# Patient Record
Sex: Female | Born: 1965 | Race: White | Hispanic: No | Marital: Married | Smoking: Never smoker
Health system: Southern US, Community
[De-identification: ages and names within clinical notes are randomized; demographics above are authoritative.]

## PROBLEM LIST (undated history)

## (undated) HISTORY — PX: BREAST EXCISIONAL BIOPSY: SUR124

---

## 1999-09-30 HISTORY — PX: BREAST EXCISIONAL BIOPSY: SUR124

## 2013-12-25 ENCOUNTER — Encounter (HOSPITAL_COMMUNITY): Payer: Self-pay | Admitting: Emergency Medicine

## 2013-12-25 ENCOUNTER — Emergency Department (HOSPITAL_COMMUNITY)
Admission: EM | Admit: 2013-12-25 | Discharge: 2013-12-25 | Disposition: A | Discharge status: Home / Self Care | Payer: Self-pay | Attending: Emergency Medicine | Admitting: Emergency Medicine

## 2013-12-25 ENCOUNTER — Emergency Department (HOSPITAL_COMMUNITY): Payer: Self-pay

## 2013-12-25 DIAGNOSIS — J45901 Unspecified asthma with (acute) exacerbation: Secondary | ICD-10-CM | POA: Insufficient documentation

## 2013-12-25 DIAGNOSIS — Z79899 Other long term (current) drug therapy: Secondary | ICD-10-CM | POA: Insufficient documentation

## 2013-12-25 DIAGNOSIS — J45909 Unspecified asthma, uncomplicated: Secondary | ICD-10-CM

## 2013-12-25 DIAGNOSIS — IMO0002 Reserved for concepts with insufficient information to code with codable children: Secondary | ICD-10-CM | POA: Insufficient documentation

## 2013-12-25 LAB — BASIC METABOLIC PANEL
BUN: 16 mg/dL (ref 6–23)
CO2: 23 mEq/L (ref 19–32)
Calcium: 9.6 mg/dL (ref 8.4–10.5)
Chloride: 101 mEq/L (ref 96–112)
Creatinine, Ser: 0.67 mg/dL (ref 0.50–1.10)
GFR calc Af Amer: >90 mL/min (ref >90)
GFR calc non Af Amer: >90 mL/min (ref >90)
Glucose, Bld: 165 mg/dL — ABNORMAL HIGH (ref 70–99)
Potassium: 3.2 mEq/L — ABNORMAL LOW (ref 3.7–5.3)
Sodium: 138 mEq/L (ref 137–147)

## 2013-12-25 LAB — CBC
HCT: 41.4 % (ref 36.0–46.0)
Hemoglobin: 13.8 g/dL (ref 12.0–15.0)
MCH: 31.6 pg (ref 26.0–34.0)
MCHC: 33.3 g/dL (ref 30.0–36.0)
MCV: 94.7 fL (ref 78.0–100.0)
PLATELETS: 224 10*3/uL (ref 150–400)
RBC: 4.37 MIL/uL (ref 3.87–5.11)
RDW: 13.2 % (ref 11.5–15.5)
WBC: 10.9 10*3/uL — AB (ref 4.0–10.5)

## 2013-12-25 MED ORDER — ALBUTEROL SULFATE HFA 108 (90 BASE) MCG/ACT IN AERS
2.0000 | INHALATION_SPRAY | RESPIRATORY_TRACT | Status: DC | PRN
Start: 2013-12-25 — End: 2019-08-15

## 2013-12-25 MED ORDER — PREDNISONE 50 MG PO TABS
60.0000 mg | ORAL_TABLET | Freq: Once | ORAL | Status: AC
  Administered 2013-12-25: 60 mg via ORAL
  Filled 2013-12-25 (×2): qty 1

## 2013-12-25 MED ORDER — OMEPRAZOLE 20 MG PO CPDR: 20.0000 mg | DELAYED_RELEASE_CAPSULE | Freq: Every day | ORAL | Status: DC

## 2013-12-25 MED ORDER — PREDNISONE 10 MG PO TABS: 60.0000 mg | ORAL_TABLET | Freq: Every day | ORAL | Status: DC

## 2013-12-25 NOTE — ED Provider Notes (Signed)
CSN: [id number]     Arrival date & time 12/25/13  0422 History   First MD Initiated Contact with Patient 12/25/13 0430     Chief Complaint  Patient presents with  . Asthma      HPI Patient reports a history of adult-onset asthma.  She started using inhalers while she was in college intermittently.  She states over the past several months she's had intermittent issues with shortness of breath.  She recently finished a course of antibiotics for suspected pneumonia by her primary care physician.  Her primary care physician she reports did do a chest x-ray by never heard the results of the chest x-ray as to whether or not she had a true infiltrate.  She has not seen a pulmonologist.  She's been using Qvar at home with intermittent relief.  She states occasionally she'll get a tightness sensation in her anterior throat with some shortness of breath.  She was in her normal state of health over the past several days and awoke early this morning with severe shortness of breath that required her to call EMS. It reported to me by the nursing  staff that when EMS showed up the patient did not have any significant wheezing on exam but seemed to respond well to albuterol.  On arrival emergency department she is without any wheezing and states that she feels significantly better.  She has no chest discomfort or pain at this time.  She denies a history of orthopnea or lower extremity edema.  History of DVT or pulmonary embolism.   History reviewed. No pertinent past medical history. History reviewed. No pertinent past surgical history. No family history on file. History  Substance Use Topics  . Smoking status: Not on file  . Smokeless tobacco: Not on file  . Alcohol Use: Not on file   OB History   Grav Para Term Preterm Abortions TAB SAB Ect Mult Living                 Review of Systems  All other systems reviewed and are negative.      Allergies  Review of patient's allergies indicates no known  allergies.  Home Medications   Current Outpatient Rx  Name  Route  Sig  Dispense  Refill  . beclomethasone (QVAR) 40 MCG/ACT inhaler   Inhalation   Inhale 2 puffs into the lungs 2 (two) times daily.         Marland Kitchen albuterol (PROVENTIL HFA;VENTOLIN HFA) 108 (90 BASE) MCG/ACT inhaler   Inhalation   Inhale 2 puffs into the lungs every 4 (four) hours as needed for wheezing or shortness of breath.   1 Inhaler   0   . predniSONE (DELTASONE) 10 MG tablet   Oral   Take 6 tablets (60 mg total) by mouth daily.   24 tablet   0    BP 141/79  Pulse 94  Temp(Src) 98.1 F (36.7 C) (Oral)  Resp 18  Ht 5\' 8"  (1.727 m)  Wt 209 lb (94.802 kg)  BMI 31.79 kg/m2  SpO2 97% Physical Exam  Nursing note and vitals reviewed. Constitutional: She is oriented to person, place, and time. She appears well-developed and well-nourished. No distress.  HENT:  Head: Normocephalic and atraumatic.  Eyes: EOM are normal.  Neck: Normal range of motion.  Cardiovascular: Normal rate, regular rhythm and normal heart sounds.   Pulmonary/Chest: Effort normal and breath sounds normal. No accessory muscle usage. Not tachypneic. No respiratory distress.  Abdominal: Soft. She  exhibits no distension. There is no tenderness.  Musculoskeletal: Normal range of motion.  Neurological: She is alert and oriented to person, place, and time.  Skin: Skin is warm and dry.  Psychiatric: She has a normal mood and affect. Judgment normal.    ED Course  Procedures (including critical care time) Labs Review Labs Reviewed  CBC - Abnormal; Notable for the following:    WBC 10.9 (*)    All other components within normal limits  BASIC METABOLIC PANEL - Abnormal; Notable for the following:    Potassium 3.2 (*)    Glucose, Bld 165 (*)    All other components within normal limits   Imaging Review Dg Chest 2 View  12/25/2013   CLINICAL DATA:  Asthma, difficulty breathing.  EXAM: CHEST  2 VIEW  COMPARISON:  DG CHEST 2V dated  10/31/2013  FINDINGS: Cardiomediastinal silhouette is unremarkable. The lungs are clear without pleural effusions or focal consolidations. Mildly increased lung volumes with trace perihilar peribronchial cuffing. Trachea projects midline and there is no pneumothorax. Soft tissue planes and included osseous structures are non-suspicious.  IMPRESSION: Increased lung volumes with trace perihilar peribronchial cuffing may reflect reactive airway disease without focal consolidation.   Electronically Signed   By: Awilda Metroourtnay  Bloomer   On: 12/25/2013 05:30  I personally reviewed the imaging tests through PACS system I reviewed available ER/hospitalization records through the EMR    EKG Interpretation None      MDM   Final diagnoses:  Reactive airway disease    Patient continues to feel better this time.  Respiratory rate is 16.  No hypoxia.  Heart rate improved.  Chest x-ray without infiltrate.  Laboratory studies are without significant abnormality.  No anemia noted.  I think the patient will benefit from follow up with a pulmonologist.  There may be a component of her symptoms that are more bronchospastic in nature.  Her symptoms seem to be worse at night and therefore I will try the patient on Prilosec as she could be getting some silent gastroesophageal reflux disease leading to bronchospasm.  Patient will likely benefit from official pulmonary function tests.  There also could be some component of anxiety or panic attacks as she had a rather rapid response in her symptoms today.    Lyanne CoKevin M Emit Kuenzel, MD 12/25/13 [phone]

## 2013-12-25 NOTE — Discharge Instructions (Signed)
Bronchospasm, Adult A bronchospasm is a spasm or tightening of the airways going into the lungs. During a bronchospasm breathing becomes more difficult because the airways get smaller. When this happens there can be coughing, a whistling sound when breathing (wheezing), and difficulty breathing. Bronchospasm is often associated with asthma, but not all patients who experience a bronchospasm have asthma. CAUSES  A bronchospasm is caused by inflammation or irritation of the airways. The inflammation or irritation may be triggered by:   Allergies (such as to animals, pollen, food, or mold). Allergens that cause bronchospasm may cause wheezing immediately after exposure or many hours later.   Infection. Viral infections are believed to be the most common cause of bronchospasm.   Exercise.   Irritants (such as pollution, cigarette smoke, strong odors, aerosol sprays, and paint fumes).   Weather changes. Winds increase molds and pollens in the air. Rain refreshes the air by washing irritants out. Cold air may cause inflammation.   Stress and emotional upset.  SIGNS AND SYMPTOMS   Wheezing.   Excessive nighttime coughing.   Frequent or severe coughing with a simple cold.   Chest tightness.   Shortness of breath.  DIAGNOSIS  Bronchospasm is usually diagnosed through a history and physical exam. Tests, such as chest X-rays, are sometimes done to look for other conditions. TREATMENT   Inhaled medicines can be given to open up your airways and help you breathe. The medicines can be given using either an inhaler or a nebulizer machine.  Corticosteroid medicines may be given for severe bronchospasm, usually when it is associated with asthma. HOME CARE INSTRUCTIONS   Always have a plan prepared for seeking medical care. Know when to call your health care provider and local emergency services (911 in the [place] Know where you can access local emergency care.  Only take medicines as  directed by your health care provider.  If you were prescribed an inhaler or nebulizer machine, ask your health care provider to explain how to use it correctly. Always use a spacer with your inhaler if you were given one.  It is necessary to remain calm during an attack. Try to relax and breathe more slowly.  Control your home environment in the following ways:   Change your heating and air conditioning filter at least once a month.   Limit your use of fireplaces and wood stoves.  Do not smoke and do not allow smoking in your home.   Avoid exposure to perfumes and fragrances.   Get rid of pests (such as roaches and mice) and their droppings.   Throw away plants if you see mold on them.   Keep your house clean and dust free.   Replace carpet with wood, tile, or vinyl flooring. Carpet can trap dander and dust.   Use allergy-proof pillows, mattress covers, and box spring covers.   Wash bed sheets and blankets every week in hot water and dry them in a dryer.   Use blankets that are made of polyester or cotton.   Wash hands frequently. SEEK MEDICAL CARE IF:   You have muscle aches.   You have chest pain.   The sputum changes from clear or white to yellow, green, gray, or bloody.   The sputum you cough up gets thicker.   There are problems that may be related to the medicine you are given, such as a rash, itching, swelling, or trouble breathing.  SEEK IMMEDIATE MEDICAL CARE IF:   You have worsening wheezing and coughing   even after taking your prescribed medicines.   You have increased difficulty breathing.   You develop severe chest pain. MAKE SURE YOU:   Understand these instructions.  Will watch your condition.  Will get help right away if you are not doing well or get worse. Document Released: 09/18/2003 Document Revised: 05/18/2013 Document Reviewed: 03/07/2013 Exit[employer] Patient Information 2014 [employer].  

## 2013-12-25 NOTE — ED Notes (Signed)
Ongoing difficulty breathing since December. States she gets short of breath but her oxygen sats are "OK" and "they can't figure out whats wrong with me". Tonight woke with difficulty breathing again and called 911

## 2015-07-26 ENCOUNTER — Ambulatory Visit (INDEPENDENT_AMBULATORY_CARE_PROVIDER_SITE_OTHER): Payer: Self-pay | Admitting: Orthopedic Surgery

## 2015-07-26 ENCOUNTER — Ambulatory Visit (INDEPENDENT_AMBULATORY_CARE_PROVIDER_SITE_OTHER): Payer: Self-pay

## 2015-07-26 VITALS — BP 131/80 | Ht 68.0 in | Wt 206.0 lb

## 2015-07-26 DIAGNOSIS — M25522 Pain in left elbow: Secondary | ICD-10-CM

## 2015-07-26 DIAGNOSIS — M7712 Lateral epicondylitis, left elbow: Secondary | ICD-10-CM

## 2015-07-26 NOTE — Progress Notes (Signed)
Patient ID: Heidi ChaLisa M Santiago, female   DOB: 1966-01-06, 49 y.o.   MRN: [id number]  Chief Complaint  Patient presents with  . Elbow Pain    left elbow pain, unknown injury    HPI Heidi Santiago is a 49 y.o. female.  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 She complains of a constant 4 out of 10 lateral elbow dull throbbing burning stabbing aching pain associated with some numbness and tingling with radiating into the wrist and upper arm. Took ibuprofen ice rest did not help. Repetitive motion lifting and certain activities cause increase in pain  Review of systems musculoskeletal back pain. Allergy hayfever seasonal allergy. Skin normal constitutional systems normal  Diabetes gestational asthma. Surgery 3 cesarean sections. Hysterectomy. Herniated disc in her back. Left breast lumpectomy. Appendectomy. Medications albuterol when necessary and ibuprofen  Allergies none  Family history diabetes asthma hypertension arthritis thyroid disease  Social history negative  Review of Systems Review of Systems   Social History Social History  Substance Use Topics  . Smoking status: Not on file  . Smokeless tobacco: Not on file  . Alcohol Use: Not on file    No Known Allergies  Current Outpatient Prescriptions  Medication Sig Dispense Refill  . albuterol (PROVENTIL HFA;VENTOLIN HFA) 108 (90 BASE) MCG/ACT inhaler Inhale 2 puffs into the lungs every 4 (four) hours as needed for wheezing or shortness of breath. 1 Inhaler 0  . IBUPROFEN PO Take by mouth.     No current facility-administered medications for this visit.       Physical Exam Physical Exam Blood pressure 131/80, height 5\' 8"  (1.727 m), weight 206 lb (93.441 kg). Appearance, there are no abnormalities in terms of appearance the patient was well-developed and well-nourished. The grooming and hygiene were normal.  Mental status  orientation, there was normal alertness and orientation Mood pleasant Ambulatory status normal with no assistive devices  Examination of the left elbow Inspection lateral epicondylar tenderness forearm tenderness as well Range of motion she does not fully extend the elbow has pain with pronation Tests for stability normal varus valgus alignment and stability Motor strength  Normal Wrist extension causes pain long finger extension against resistance causes pain Skin warm dry and intact without laceration or ulceration or erythema Neurologic examination normal sensation Vascular examination normal pulses with warm extremity and normal capillary refill  The opposite extremity normal range of motion    Data Reviewed The x-rays that I ordered I independently reviewed and my interpretation are that x-ray is normal  Assessment  Encounter Diagnosis  Name Primary?  . Tennis elbow syndrome, left Yes    Procedure note injection for left tennis elbow  Diagnosis left tennis elbow  Anesthesia ethyl chloride was used Alcohol use is clean the skin  After we obtained verbal consent and timeout a 25-gauge needle was used to inject 40 mg of Depo-Medrol and 3 cc of 1% lidocaine just distal to the insertion of the ECRB  There were no complications and a sterile bandage was applied.   Plan  We did not use tennis elbow brace we used wrist splint because of the forearm pain and tenderness Super 7 for sizes Injection Ice Ibuprofen Return 6 weeks

## 2015-07-26 NOTE — Patient Instructions (Signed)
Do the exercises daily  Ice 20 min 3 times a day   Wear splint   Continue ibuprofen

## 2015-08-13 ENCOUNTER — Telehealth: Payer: Self-pay | Admitting: *Deleted

## 2015-08-13 NOTE — Telephone Encounter (Signed)
Routing to Dr Harrison for review 

## 2015-08-13 NOTE — Telephone Encounter (Signed)
Patient called stating her left elbow is getting worse, patient would like to know if she can have therapy, or if there is any other alternatives she can do. Please advise  [phone]

## 2015-08-14 NOTE — Telephone Encounter (Signed)
Yes therapy  3 x a week x 4 weeks

## 2015-08-15 NOTE — Telephone Encounter (Signed)
Patient aware and states does not need formal referral since she does not have insurance and has [facility] therapist [facility]

## 2015-08-16 ENCOUNTER — Other Ambulatory Visit: Payer: Self-pay | Admitting: *Deleted

## 2015-08-16 DIAGNOSIS — M7712 Lateral epicondylitis, left elbow: Secondary | ICD-10-CM

## 2015-08-16 NOTE — Telephone Encounter (Signed)
Pt orders printed

## 2015-08-16 NOTE — Telephone Encounter (Signed)
Patient aware therapy orders faxed.

## 2015-08-16 NOTE — Telephone Encounter (Signed)
Patient does in fact require an order for physical therapy as noted:  Please send orders to:  [facility], for therapist:  Earnestine LeysRichard Galinski; ph# [phone]/ fax# [phone].  Patient aware nurse will enter orders and we will [facility].

## 2015-09-06 ENCOUNTER — Ambulatory Visit: Payer: Self-pay | Admitting: Orthopedic Surgery

## 2015-11-13 ENCOUNTER — Other Ambulatory Visit: Payer: Self-pay

## 2015-11-13 DIAGNOSIS — Z1231 Encounter for screening mammogram for malignant neoplasm of breast: Secondary | ICD-10-CM

## 2015-11-30 ENCOUNTER — Ambulatory Visit
Admission: RE | Admit: 2015-11-30 | Discharge: 2015-11-30 | Disposition: A | Discharge status: Home / Self Care | Payer: No Typology Code available for payment source | Source: Ambulatory Visit

## 2015-11-30 DIAGNOSIS — Z1231 Encounter for screening mammogram for malignant neoplasm of breast: Secondary | ICD-10-CM

## 2015-12-24 ENCOUNTER — Ambulatory Visit: Payer: Self-pay

## 2017-01-02 ENCOUNTER — Other Ambulatory Visit: Payer: Self-pay | Admitting: Physician Assistant

## 2017-01-02 DIAGNOSIS — Z1231 Encounter for screening mammogram for malignant neoplasm of breast: Secondary | ICD-10-CM

## 2017-01-20 ENCOUNTER — Ambulatory Visit: Payer: No Typology Code available for payment source

## 2017-02-26 ENCOUNTER — Ambulatory Visit
Admission: RE | Admit: 2017-02-26 | Discharge: 2017-02-26 | Disposition: A | Discharge status: Home / Self Care | Payer: No Typology Code available for payment source | Source: Ambulatory Visit | Attending: Physician Assistant | Admitting: Physician Assistant

## 2017-02-26 DIAGNOSIS — Z1231 Encounter for screening mammogram for malignant neoplasm of breast: Secondary | ICD-10-CM

## 2019-07-19 ENCOUNTER — Other Ambulatory Visit: Payer: Self-pay

## 2019-07-20 ENCOUNTER — Ambulatory Visit: Payer: Self-pay | Attending: Oncology | Admitting: *Deleted

## 2019-07-20 ENCOUNTER — Other Ambulatory Visit: Payer: Self-pay

## 2019-07-20 ENCOUNTER — Encounter: Payer: Self-pay | Admitting: *Deleted

## 2019-07-20 ENCOUNTER — Ambulatory Visit
Admission: RE | Admit: 2019-07-20 | Discharge: 2019-07-20 | Disposition: A | Discharge status: Home / Self Care | Payer: PRIVATE HEALTH INSURANCE | Source: Ambulatory Visit | Attending: Oncology | Admitting: Oncology

## 2019-07-20 VITALS — BP 153/85 | HR 69 | Temp 98.7°F | Ht 69.0 in | Wt 207.0 lb

## 2019-07-20 DIAGNOSIS — Z0000 Encounter for general adult medical examination without abnormal findings: Secondary | ICD-10-CM

## 2019-07-20 NOTE — Progress Notes (Signed)
  Subjective:     Patient ID: Heidi Santiago, female   DOB: [birth year], 53 y.o.   MRN: [id number]  HPI   Review of Systems     Objective:   Physical Exam Chest:     Breasts: Breasts are asymmetrical.        Right: No swelling, bleeding, inverted nipple, mass, nipple discharge, skin change or tenderness.        Left: No swelling, bleeding, inverted nipple, mass, nipple discharge, skin change or tenderness.       Comments: Right breast 2 cup sizes larger than the left Lymphadenopathy:     Upper Body:     Right upper body: No supraclavicular or axillary adenopathy.     Left upper body: No supraclavicular or axillary adenopathy.        Assessment:     53 year old White female presents to [facility] with complaints of finding a "thickening" in her right breast.  Patient states she thinks she found a thickening in the right breast within the last couple of weeks.  On clinical breast exam there is no dominant mass, skin changes, nipple discharge, or lymphadenopathy.  I did have the patient get into the position that she can palpate the "thickening".  I was still not able to palpate a mass or thickening.  I had the patient find the area of concern again and she states "it doesn't feel as prominent as before."  Taught self breast awareness.  Patient with a history of hysterectomy.  Pap smear omitted per protocol.  Tyrer-Cuzick breast cancer risk assessment with a lifetime risk of 11.3%.  Per NCCN guidelines no further imagaing or genetic testing is recommended.  Patient has been screened for eligibility.  She does not have any insurance, Medicare or Medicaid.  She also meets financial eligibility.  Hand-out given on the Affordable Care Act.    Plan:     Screening mammogram ordered.  Patient is to continue self breast exams and if she notices changes or area of concern returns she is to call and we can reassess her at that time.  She is agreeable to the plan.

## 2019-07-26 ENCOUNTER — Encounter: Payer: Self-pay | Admitting: *Deleted

## 2019-07-28 ENCOUNTER — Encounter: Payer: Self-pay | Admitting: *Deleted

## 2019-07-28 NOTE — Progress Notes (Signed)
Letter mailed from the [facility] to inform patient of her normal mammogram results.  Patient is to follow-up with annual screening in one year.  HSIS to Christy. 

## 2019-08-14 ENCOUNTER — Emergency Department (HOSPITAL_COMMUNITY)
Admission: EM | Admit: 2019-08-14 | Discharge: 2019-08-15 | Disposition: A | Discharge status: Home / Self Care | Payer: Self-pay | Attending: Emergency Medicine | Admitting: Emergency Medicine

## 2019-08-14 ENCOUNTER — Other Ambulatory Visit: Payer: Self-pay

## 2019-08-14 ENCOUNTER — Encounter (HOSPITAL_COMMUNITY): Payer: Self-pay | Admitting: Emergency Medicine

## 2019-08-14 DIAGNOSIS — K869 Disease of pancreas, unspecified: Secondary | ICD-10-CM

## 2019-08-14 DIAGNOSIS — N139 Obstructive and reflux uropathy, unspecified: Secondary | ICD-10-CM

## 2019-08-14 LAB — COMPREHENSIVE METABOLIC PANEL
ALT: 28 U/L (ref 0–44)
AST: 20 U/L (ref 15–41)
Albumin: 4.2 g/dL (ref 3.5–5.0)
Alkaline Phosphatase: 77 U/L (ref 38–126)
Anion gap: 15 (ref 5–15)
BUN: 18 mg/dL (ref 6–20)
CO2: 19 mmol/L — ABNORMAL LOW (ref 22–32)
Calcium: 9.7 mg/dL (ref 8.9–10.3)
Chloride: 105 mmol/L (ref 98–111)
Creatinine, Ser: 0.92 mg/dL (ref 0.44–1.00)
GFR calc Af Amer: >60 mL/min (ref >60)
GFR calc non Af Amer: >60 mL/min (ref >60)
Glucose, Bld: 252 mg/dL — ABNORMAL HIGH (ref 70–99)
Potassium: 4.1 mmol/L (ref 3.5–5.1)
Sodium: 139 mmol/L (ref 135–145)
Total Bilirubin: 0.7 mg/dL (ref 0.3–1.2)
Total Protein: 7.3 g/dL (ref 6.5–8.1)

## 2019-08-14 LAB — CBC
HCT: 43.3 % (ref 36.0–46.0)
Hemoglobin: 14.2 g/dL (ref 12.0–15.0)
MCH: 31.1 pg (ref 26.0–34.0)
MCHC: 32.8 g/dL (ref 30.0–36.0)
MCV: 94.7 fL (ref 80.0–100.0)
Platelets: 222 10*3/uL (ref 150–400)
RBC: 4.57 MIL/uL (ref 3.87–5.11)
RDW: 12.4 % (ref 11.5–15.5)
WBC: 14.7 10*3/uL — ABNORMAL HIGH (ref 4.0–10.5)
nRBC: 0 % (ref 0.0–0.2)

## 2019-08-14 LAB — URINALYSIS, ROUTINE W REFLEX MICROSCOPIC
Bacteria, UA: NONE SEEN
Bilirubin Urine: NEGATIVE
Glucose, UA: >=500 mg/dL — AB
Hgb urine dipstick: NEGATIVE
Ketones, ur: 20 mg/dL — AB
Leukocytes,Ua: NEGATIVE
Nitrite: NEGATIVE
Protein, ur: 30 mg/dL — AB
Specific Gravity, Urine: 1.021 (ref 1.005–1.030)
pH: 7 (ref 5.0–8.0)

## 2019-08-14 MED ORDER — ONDANSETRON 4 MG PO TBDP
4.0000 mg | ORAL_TABLET | Freq: Once | ORAL | Status: AC
  Administered 2019-08-14: 20:00:00 4 mg via ORAL
  Filled 2019-08-14: qty 1

## 2019-08-14 NOTE — ED Triage Notes (Signed)
Patient with history kidney stones, she states that she is having left flank and lower back pain.  She is having nausea and vomiting.

## 2019-08-15 ENCOUNTER — Other Ambulatory Visit: Payer: Self-pay

## 2019-08-15 ENCOUNTER — Emergency Department (HOSPITAL_COMMUNITY): Payer: Self-pay

## 2019-08-15 MED ORDER — ONDANSETRON 4 MG PO TBDP: 4.0000 mg | ORAL_TABLET | Freq: Three times a day (TID) | ORAL | 0 refills | Status: DC | PRN

## 2019-08-15 MED ORDER — KETOROLAC TROMETHAMINE 30 MG/ML IJ SOLN
30.0000 mg | Freq: Once | INTRAMUSCULAR | Status: AC
  Administered 2019-08-15: 07:00:00 30 mg via INTRAVENOUS
  Filled 2019-08-15: qty 1

## 2019-08-15 MED ORDER — IOHEXOL 300 MG/ML  SOLN
100.0000 mL | Freq: Once | INTRAMUSCULAR | Status: AC | PRN
  Administered 2019-08-15: 05:00:00 100 mL via INTRAVENOUS

## 2019-08-15 MED ORDER — SODIUM CHLORIDE 0.9 % IV BOLUS
1000.0000 mL | Freq: Once | INTRAVENOUS | Status: AC
  Administered 2019-08-15: 04:00:00 1000 mL via INTRAVENOUS

## 2019-08-15 MED ORDER — OXYCODONE-ACETAMINOPHEN 5-325 MG PO TABS: 1.0000 | ORAL_TABLET | Freq: Four times a day (QID) | ORAL | 0 refills | Status: DC | PRN

## 2019-08-15 MED ORDER — TAMSULOSIN HCL 0.4 MG PO CAPS: 0.4000 mg | ORAL_CAPSULE | Freq: Every day | ORAL | 0 refills | Status: DC

## 2019-08-15 MED ORDER — ONDANSETRON HCL 4 MG/2ML IJ SOLN
4.0000 mg | Freq: Once | INTRAMUSCULAR | Status: AC
  Administered 2019-08-15: 04:00:00 4 mg via INTRAVENOUS
  Filled 2019-08-15: qty 2

## 2019-08-15 NOTE — ED Provider Notes (Signed)
[facility] EMERGENCY DEPARTMENT Provider Note   CSN: [id number] Arrival date & time: 08/14/19  1912     History   Chief Complaint Chief Complaint  Patient presents with   Flank Pain    HPI Heidi Santiago is a 53 y.o. female.     The history is provided by the patient. No language interpreter was used.  Flank Pain This is a new problem. The current episode started 6 to 12 hours ago. The problem occurs constantly. The problem has been gradually improving. Associated symptoms include abdominal pain. Pertinent negatives include no chest pain, no headaches and no shortness of breath. The symptoms are aggravated by bending, twisting, walking and standing. Nothing relieves the symptoms. Treatments tried: Percocet.    History reviewed. No pertinent past medical history.  There are no active problems to display for this patient.   Past Surgical History:  Procedure Laterality Date   BREAST EXCISIONAL BIOPSY Left      OB History   No obstetric history on file.      Home Medications    Prior to Admission medications   Medication Sig Start Date End Date Taking? Authorizing Provider  ondansetron (ZOFRAN ODT) 4 MG disintegrating tablet Take 1 tablet (4 mg total) by mouth every 8 (eight) hours as needed. 08/15/19   Jaycelynn Knickerbocker A, PA-C  oxyCODONE-acetaminophen (PERCOCET/ROXICET) 5-325 MG tablet Take 1 tablet by mouth every 6 (six) hours as needed for severe pain. 08/15/19   Jolina Symonds A, PA-C  tamsulosin (FLOMAX) 0.4 MG CAPS capsule Take 1 capsule (0.4 mg total) by mouth daily. 08/15/19   Saharsh Sterling A, PA-C  albuterol (PROVENTIL HFA;VENTOLIN HFA) 108 (90 BASE) MCG/ACT inhaler Inhale 2 puffs into the lungs every 4 (four) hours as needed for wheezing or shortness of breath. Patient not taking: Reported on 08/15/2019 12/25/13 08/15/19  Azalia Bilis, MD    Family History No family history on file.  Social History Social History   Tobacco Use    Smoking status: Never Smoker   Smokeless tobacco: Never Used  Substance Use Topics   Alcohol use: Never    Frequency: Never   Drug use: Never     Allergies   Patient has no known allergies.   Review of Systems Review of Systems  Constitutional: Negative for activity change, chills and fever.  Respiratory: Negative for shortness of breath.   Cardiovascular: Negative for chest pain.  Gastrointestinal: Positive for abdominal pain, nausea and vomiting. Negative for constipation and diarrhea.  Genitourinary: Positive for flank pain. Negative for dysuria.  Musculoskeletal: Negative for back pain.  Skin: Negative for rash.  Allergic/Immunologic: Negative for immunocompromised state.  Neurological: Negative for headaches.  Psychiatric/Behavioral: Negative for confusion.   Physical Exam Updated Vital Signs BP 140/75    Pulse 75    Temp 98.7 F (37.1 C) (Oral)    Resp 14    SpO2 92%   Physical Exam Vitals signs and nursing note reviewed.  Constitutional:      General: She is not in acute distress.    Appearance: She is obese. She is not ill-appearing, toxic-appearing or diaphoretic.     Comments: Well appearing. NAD.   HENT:     Head: Normocephalic.  Eyes:     Conjunctiva/sclera: Conjunctivae normal.  Neck:     Musculoskeletal: Neck supple.  Cardiovascular:     Rate and Rhythm: Normal rate and regular rhythm.     Pulses: Normal pulses.     Heart sounds: Normal  heart sounds. No murmur. No friction rub. No gallop.   Pulmonary:     Effort: Pulmonary effort is normal. No respiratory distress.     Breath sounds: No stridor. No wheezing, rhonchi or rales.  Chest:     Chest wall: No tenderness.  Abdominal:     General: There is no distension.     Palpations: Abdomen is soft.     Comments: Abdomen is obese, but soft and nondistended.  She has maximal tenderness to palpation in the left upper quadrant, but is also tender in the left lower quadrant.  There is no rebound or  guarding.  She has no suprapubic or epigastric tenderness as well as no right-sided tenderness.  No CVA tenderness bilaterally.  Bowel sounds are hypoactive in all 4 quadrants.  Musculoskeletal:     Right lower leg: No edema.     Left lower leg: No edema.  Skin:    General: Skin is warm.     Findings: No rash.  Neurological:     Mental Status: She is alert.  Psychiatric:        Behavior: Behavior normal.      ED Treatments / Results  Labs (all labs ordered are listed, but only abnormal results are displayed) Labs Reviewed  CBC - Abnormal; Notable for the following components:      Result Value   WBC 14.7 (*)    All other components within normal limits  COMPREHENSIVE METABOLIC PANEL - Abnormal; Notable for the following components:   CO2 19 (*)    Glucose, Bld 252 (*)    All other components within normal limits  URINALYSIS, ROUTINE W REFLEX MICROSCOPIC - Abnormal; Notable for the following components:   APPearance TURBID (*)    Glucose, UA >=500 (*)    Ketones, ur 20 (*)    Protein, ur 30 (*)    All other components within normal limits    EKG None  Radiology Ct Abdomen Pelvis W Contrast  Result Date: 08/15/2019 CLINICAL DATA:  Abdominal pain with diverticulitis suspected EXAM: CT ABDOMEN AND PELVIS WITH CONTRAST TECHNIQUE: Multidetector CT imaging of the abdomen and pelvis was performed using the standard protocol following bolus administration of intravenous contrast. CONTRAST:  OMNIPAQUE IOHEXOL 300 MG/ML  SOLN COMPARISON:  None. FINDINGS: Lower chest:  No contributory findings. Hepatobiliary: Subcentimeter cystic density in the subcapsular right liver. Hepatic steatosis with central sparing.No evidence of biliary obstruction or stone. Pancreas: Cystic density mass in the pancreatic body without definite enhancing septation or nodule, 15 mm. No main duct dilatation. Spleen: Unremarkable. Adrenals/Urinary Tract: Negative adrenals. Left hydroureteronephrosis,  perinephric stranding, and delayed renal enhancement due to a 3 mm stone at the UVJ. No additional nephrolithiasis. No right hydronephrosis. Stomach/Bowel: No obstruction. No pericecal inflammation. Uncomplicated distal duodenal diverticulum. Mild scattered colonic diverticulosis. Vascular/Lymphatic: No acute vascular abnormality. Atherosclerotic calcification of the aorta. No mass or adenopathy. Reproductive:Hysterectomy. Other: No ascites or pneumoperitoneum. Musculoskeletal: No acute abnormalities. L5-S1 focal advanced disc degeneration. IMPRESSION: 1. Obstructing 3 mm stone at the left UVJ. 2. 15 mm cystic mass in the pancreatic body, recommend follow-up for pancreas MRI with contrast in this young patient. 3. Hepatic steatosis and mild diverticulosis. 4.  Aortic Atherosclerosis (ICD10-I70.0). Electronically Signed   By: Marnee Spring M.D.   On: 08/15/2019 05:20    Procedures Procedures (including critical care time)  Medications Ordered in ED Medications  ondansetron (ZOFRAN-ODT) disintegrating tablet 4 mg (4 mg Oral Given 08/14/19 2029)  sodium chloride 0.9 %  bolus 1,000 mL (0 mLs Intravenous Stopped 08/15/19 0711)  ondansetron (ZOFRAN) injection 4 mg (4 mg Intravenous Given 08/15/19 0414)  iohexol (OMNIPAQUE) 300 MG/ML solution 100 mL (100 mLs Intravenous Contrast Given 08/15/19 0505)  ketorolac (TORADOL) 30 MG/ML injection 30 mg (30 mg Intravenous Given 08/15/19 0651)     Initial Impression / Assessment and Plan / ED Course  I have reviewed the triage vital signs and the nursing notes.  Pertinent labs & imaging results that were available during my care of the patient were reviewed by me and considered in my medical decision making (see chart for details).        53 year old female with a history of kidney stones who presents to the emergency department with a chief complaint of left flank pain that began suddenly at 1600.  She reports that the pain was sharp and was present over  the left entire flank and radiated to her left upper and lower abdomen.  She reports that pain worsened with movement and she had a difficult time finding a comfortable position.  She had associated nausea and multiple episodes of nonbloody, nonbilious vomiting.  Although she states that the pain felt similar to previous kidney stones, she reports that typically she would also have hematuria, which she did not have today.  She denies dysuria, urinary frequency or hesitancy in addition to hematuria.  She reports that she took 1 tablet of Percocet prior to arrival.  She reports that her pain was constant since onset, but began to dissipate over the last hour, which is also unusual based on her history of kidney stones.  She denies fevers, chills, diarrhea, constipation, vaginal discharge, bleeding, or pain, cough, shortness of breath, or chest pain.  She is not currently followed by urology as she has been able to pass previous stones by taking Flomax at home.  She reports that her A1c was elevated previously and she has changed her diet to be very vegetable heavy, denies any recent crash diets.  She reports that she is being followed for high A1c but does not currently take any medication for diabetes as she was previously prediabetic.  She denies any rectal pain or straining to have bowel movements at home.  States she will have a bowel movement almost every other day and is passing gas without difficulty.  Surgical history includes prior appendectomy and cesarean sections.   On arrival to the ER, she is hypertensive, but is afebrile and without tachycardia, tachypnea, or hypoxia.  On exam, she has no CVA tenderness on the left.  She is tender to palpation maximally in the left upper quadrant, but there is also some left lower quadrant tenderness, but no peritoneal signs.  Her urinalysis demonstrates glucosuria.  She is not currently taking any SGLT-2 medications.  Her glucose is 252 in the ER and bicarb is  minimally decreased to 19, but anion gap is normal.  I suspect that her blood sugars have probably been running high since she has glucosuria and has not previously had her blood work checked by primary care.  Otherwise, her urine appears slightly concentrated, but does not appear infected and there are no RBCs or hemoglobinuria that would make me more suspicious for a kidney stone.  Labs are also notable for a leukocytosis of 14.7.  Given her left-sided abdominal pain with leukocytosis and vomiting, will order CT abdomen pelvis with contrast.  Differential diagnosis includes diverticulitis, SBO, but this is less likely as she is passing flatus versus  atypical presentation of kidney stone.  She declines any pain medication or Zofran at this time as her symptoms are well controlled.   CT scan with 3 mm obstructive stone at the UVJ.  There is also a 15 mm cystic mass in the pancreatic body and a pancreatic MRI with contrast is recommended in the outpatient setting for follow-up.  These findings were discussed with the patient.  She has been given a urine strainer, Flomax, and pain medication as well as a referral to [facility] if she does not pass the stone on her own within the next week.  She was also given return precautions to the ER.  Reports that she will follow up with primary care to get an outpatient pancreatic MRI with contrast scheduled.  She was requesting a dose of Toradol, which has been given.  All questions answered.  She is hemodynamically stable and in no acute distress.  Safe for discharge to home with outpatient follow-up as indicated.   Final Clinical Impressions(s) / ED Diagnoses   Final diagnoses:  Obstructive uropathy  Pancreatic lesion    ED Discharge Orders         Ordered    oxyCODONE-acetaminophen (PERCOCET/ROXICET) 5-325 MG tablet  Every 6 hours PRN     08/15/19 0630    ondansetron (ZOFRAN ODT) 4 MG disintegrating tablet  Every 8 hours PRN     08/15/19 0630     tamsulosin (FLOMAX) 0.4 MG CAPS capsule  Daily     08/15/19 0630           Scarlettrose Costilow A, PA-C 08/15/19 0713    Ward, Layla MawKristen N, DO 08/15/19 0720

## 2019-08-15 NOTE — Discharge Instructions (Addendum)
Thank you for allowing me to care for you today in the Emergency Department.   Please follow-up with your primary care provider as you will likely need an MRI of your abdomen with contrast to further evaluate your pancreas as we discussed in the ER today.  You can take ibuprofen and Tylenol for pain.  If your pain becomes unbearable, you can take 1 tablet of Percocet every 6 hours as needed.  Do not work or drive while taking this medication because it can make you drowsy and cause you to be impaired.  Do not take with other sedating substances, such as alcohol.  Each tablet of Percocet contains 325 mg of Tylenol.  Do not take more than 4000 mg of Tylenol from all sources in a 24-hour period.  You can take 1 tablet of Zofran and let it dissolve under your tongue every 8 hours as needed for nausea or vomiting.  If you do not pass your kidney stone within the next week, please call and schedule follow-up appointment with [facility].  The contact information is listed above.  He should return to the emergency department if you develop high fevers, persistent vomiting despite taking Zofran, if you stop making urine, if you develop uncontrollable abdominal pain, or other new, concerning symptoms.

## 2019-08-26 ENCOUNTER — Other Ambulatory Visit (HOSPITAL_COMMUNITY): Payer: Self-pay | Admitting: Physician Assistant

## 2019-08-26 ENCOUNTER — Other Ambulatory Visit: Payer: Self-pay | Admitting: Physician Assistant

## 2019-08-26 DIAGNOSIS — K8689 Other specified diseases of pancreas: Secondary | ICD-10-CM

## 2019-09-02 ENCOUNTER — Ambulatory Visit (HOSPITAL_COMMUNITY): Payer: Self-pay

## 2021-09-10 IMAGING — CT CT ABD-PELV W/ CM
2 of 5 series · 16 of 46 positions shown, 18 images · IV contrast (Omni 300)
Comparison: None.

CLINICAL DATA: Abdominal pain with diverticulitis suspected

EXAM:
CT ABDOMEN AND PELVIS WITH CONTRAST
TECHNIQUE: Multidetector CT imaging of the abdomen and pelvis was performed
using the standard protocol following bolus administration of
intravenous contrast.
CONTRAST:  100mL OMNIPAQUE IOHEXOL 300 MG/ML  SOLN

[Series 3: a/p w/ 5mm · axial · 0.98mm/px · z∈[+613,+1083]mm · 13 of 106 slices shown, 15 images]
[im 6/106  soft-tissue]
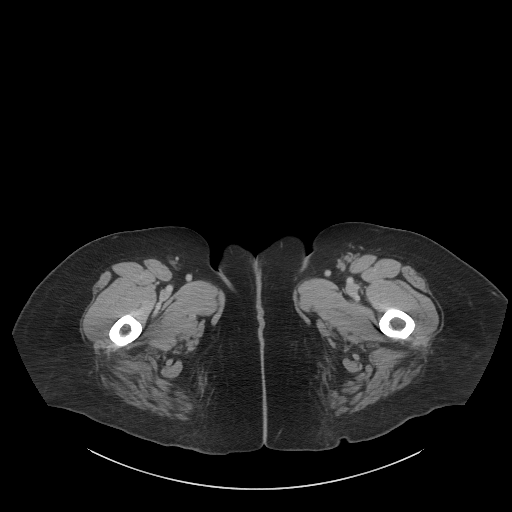
[im 6/106  bone]
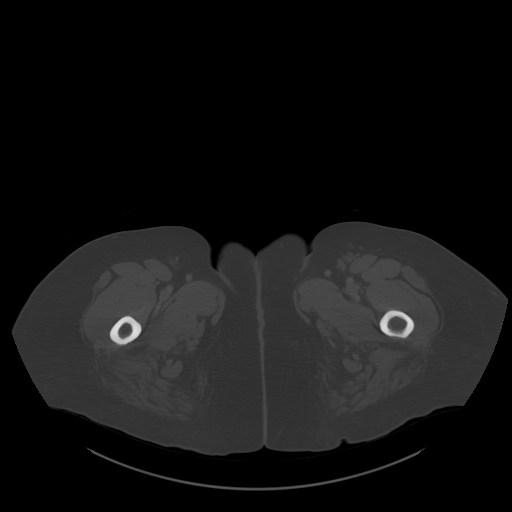
[im 16/106  soft-tissue]
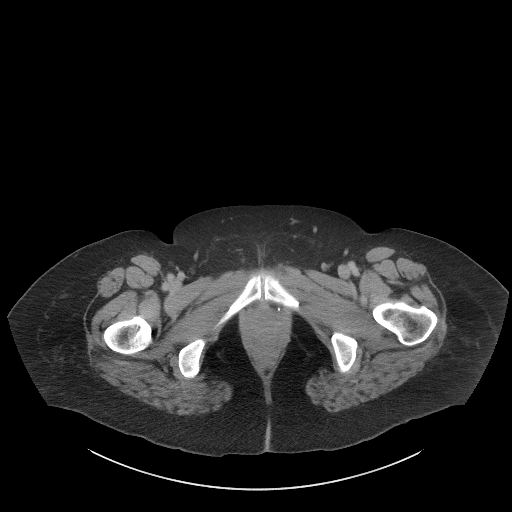
[im 22/106  soft-tissue]
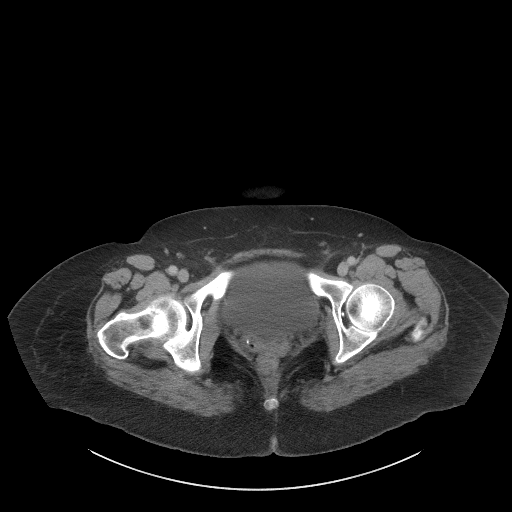
[im 32/106  soft-tissue]
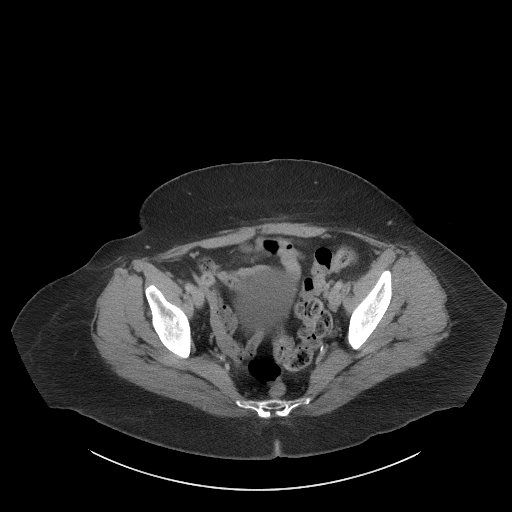
[im 37/106  soft-tissue]
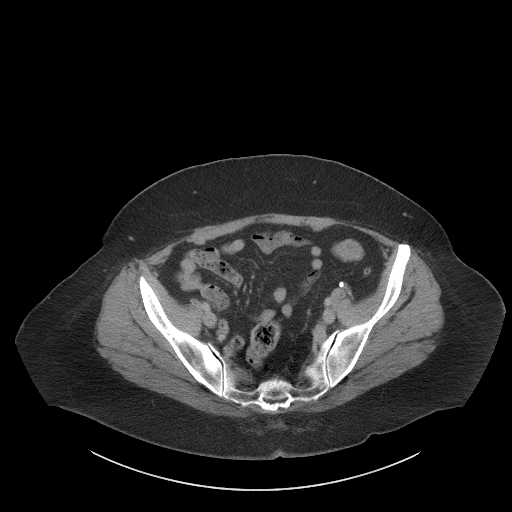
[im 48/106  soft-tissue]
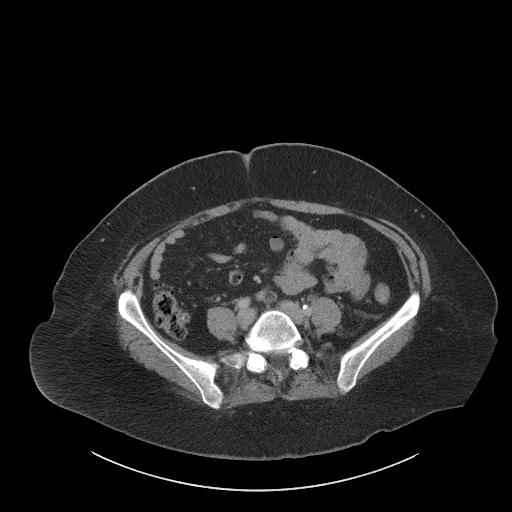
[im 53/106  soft-tissue]
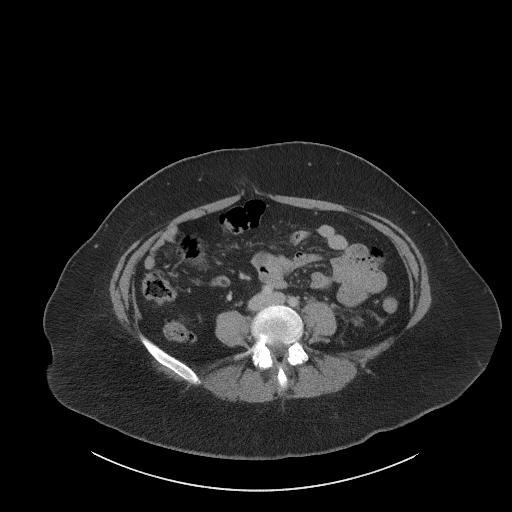
[im 58/106  soft-tissue]
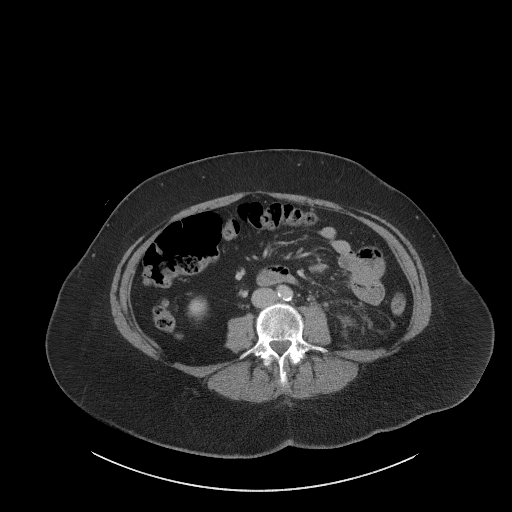
[im 69/106  soft-tissue]
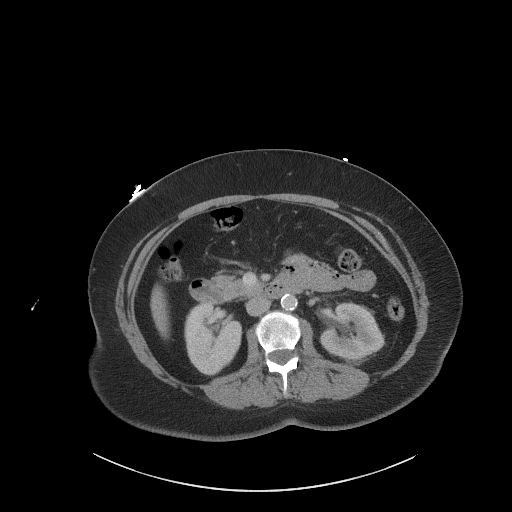
[im 69/106  bone]
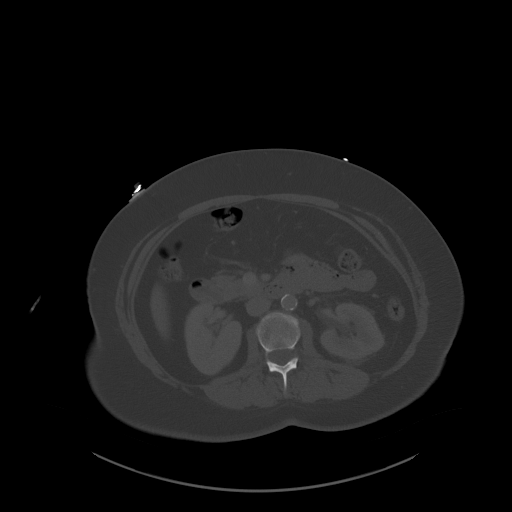
[im 74/106  soft-tissue]
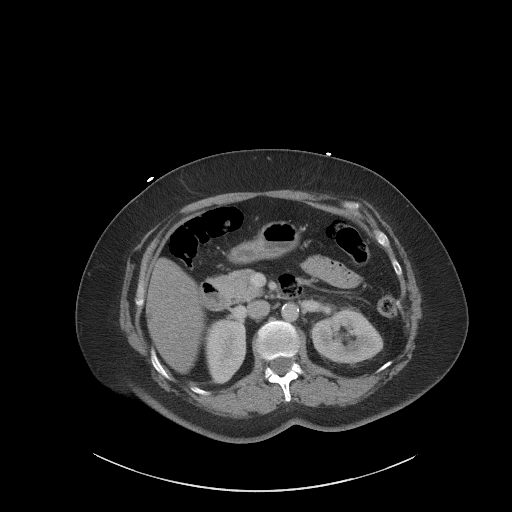
[im 85/106  soft-tissue]
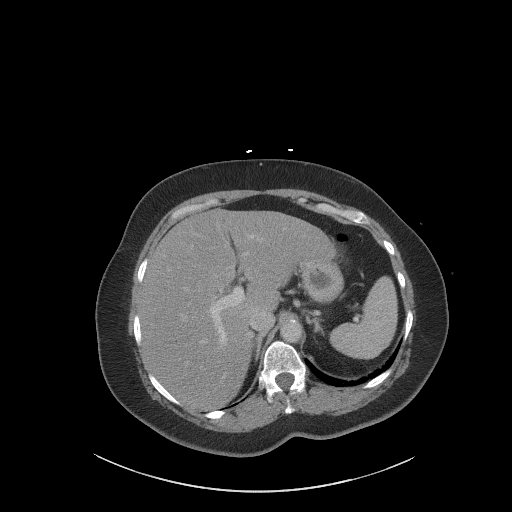
[im 90/106  soft-tissue]
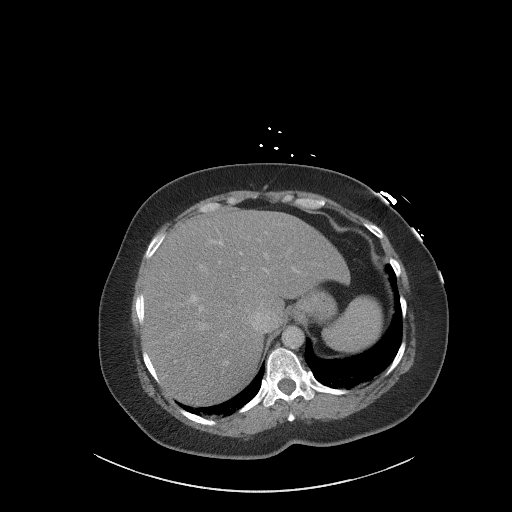
[im 100/106  soft-tissue]
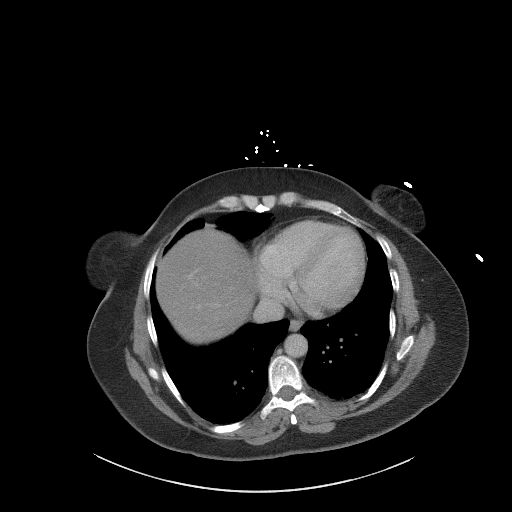

[Series 6: a/p w/ cor · coronal · 0.82mm/px · 3 of 144 slices shown]
[im 48/144  soft-tissue]
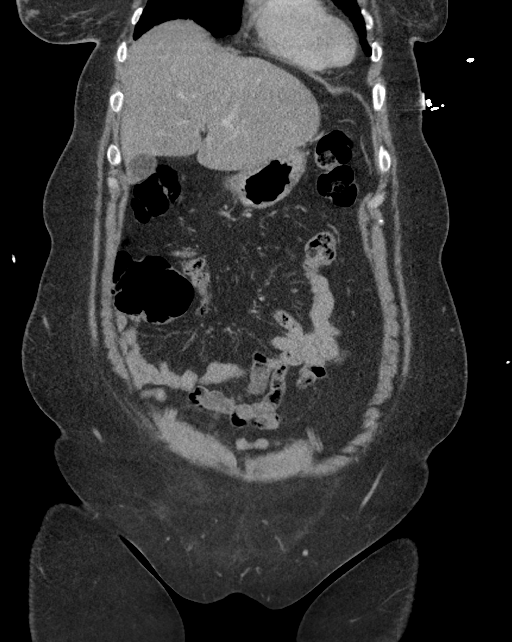
[im 64/144  soft-tissue]
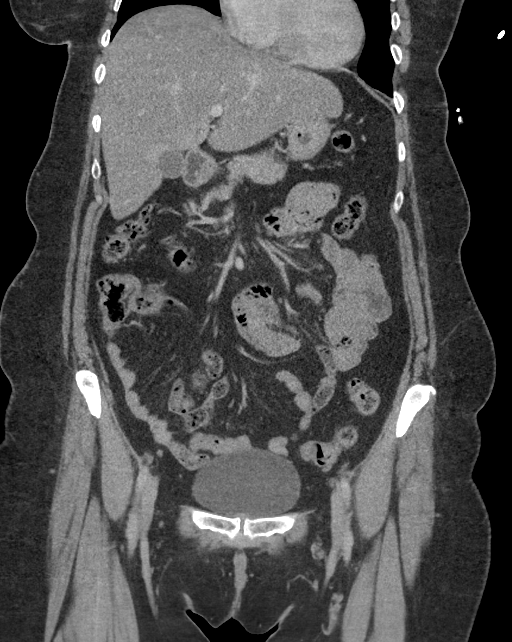
[im 80/144  soft-tissue]
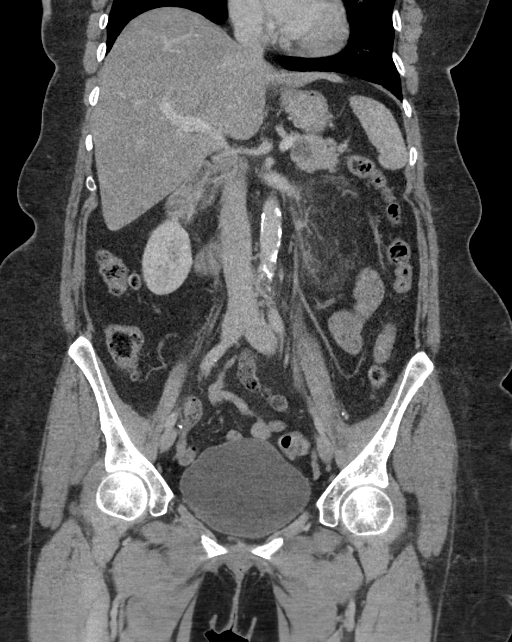

[16 of 46 positions shown; findings below may reference images not displayed]

FINDINGS: Lower chest:  No contributory findings.

Hepatobiliary: Subcentimeter cystic density in the subcapsular right
liver. Hepatic steatosis with central sparing.No evidence of biliary
obstruction or stone.

Pancreas: Cystic density mass in the pancreatic body without
definite enhancing septation or nodule, 15 mm. No main duct
dilatation.

Spleen: Unremarkable.

Adrenals/Urinary Tract: Negative adrenals. Left
hydroureteronephrosis, perinephric stranding, and delayed renal
enhancement due to a 3 mm stone at the UVJ. No additional
nephrolithiasis. No right hydronephrosis.

Stomach/Bowel: No obstruction. No pericecal inflammation.
Uncomplicated distal duodenal diverticulum. Mild scattered colonic
diverticulosis.

Vascular/Lymphatic: No acute vascular abnormality. Atherosclerotic
calcification of the aorta. No mass or adenopathy.

Reproductive:Hysterectomy.

Other: No ascites or pneumoperitoneum.

Musculoskeletal: No acute abnormalities. L5-S1 focal advanced disc
degeneration.
IMPRESSION: 1. Obstructing 3 mm stone at the left UVJ.
2. 15 mm cystic mass in the pancreatic body, recommend follow-up for
pancreas MRI with contrast in this young patient.
3. Hepatic steatosis and mild diverticulosis.
4.  Aortic Atherosclerosis ([id number]).

## 2023-01-05 NOTE — Progress Notes (Signed)
Pt rescheduled

## 2023-01-07 ENCOUNTER — Ambulatory Visit: Payer: BLUE CROSS/BLUE SHIELD | Admitting: Internal Medicine

## 2023-01-07 DIAGNOSIS — Z91199 Patient's noncompliance with other medical treatment and regimen due to unspecified reason: Secondary | ICD-10-CM

## 2023-06-25 ENCOUNTER — Other Ambulatory Visit: Payer: Self-pay

## 2023-06-25 DIAGNOSIS — Z1231 Encounter for screening mammogram for malignant neoplasm of breast: Secondary | ICD-10-CM

## 2023-07-13 ENCOUNTER — Ambulatory Visit: Payer: Self-pay

## 2023-10-02 ENCOUNTER — Other Ambulatory Visit: Payer: Self-pay

## 2023-10-02 DIAGNOSIS — Z1231 Encounter for screening mammogram for malignant neoplasm of breast: Secondary | ICD-10-CM

## 2023-10-26 ENCOUNTER — Ambulatory Visit: Payer: PRIVATE HEALTH INSURANCE | Attending: Pediatrics | Admitting: *Deleted

## 2023-10-26 ENCOUNTER — Ambulatory Visit
Admission: RE | Admit: 2023-10-26 | Discharge: 2023-10-26 | Disposition: A | Discharge status: Home / Self Care | Payer: Self-pay | Source: Ambulatory Visit | Attending: Obstetrics and Gynecology | Admitting: Obstetrics and Gynecology

## 2023-10-26 VITALS — BP 140/77 | Wt 210.0 lb

## 2023-10-26 DIAGNOSIS — Z1231 Encounter for screening mammogram for malignant neoplasm of breast: Secondary | ICD-10-CM | POA: Insufficient documentation

## 2023-10-26 DIAGNOSIS — Z1239 Encounter for other screening for malignant neoplasm of breast: Secondary | ICD-10-CM

## 2023-10-26 NOTE — Patient Instructions (Signed)
Explained breast self awareness with Heidi Santiago. Patient did not need a Pap smear today due to patient has a history of a hysterectomy for benign reasons. Let patient know that she doesn't need any further Pap smears due to her history of a hysterectomy for benign reasons. Referred patient to the [facility] for a screening mammogram. Appointment scheduled Monday, October 26, 2023 at 1440. Patient aware of appointment and will be there. Let patient know Heidi Santiago will follow up with her within the next couple weeks with results of mammogram by letter or phone. Heidi Santiago verbalized understanding.  Heidi Santiago, Heidi Maser, RN 1:50 PM

## 2023-10-26 NOTE — Progress Notes (Signed)
Ms. Heidi Santiago is a 58 y.o. female who presents to [facility] clinic today with no complaints.    Pap Smear: Pap smear not completed today. Last Pap smear was in 2002 at a clinic in [place] and was normal per patient. Per patient has no history of an abnormal Pap smear. Per patient has a history of a hysterectomy for cysts and uterine thinning after her last pregnancy. Patient doesn't need any further Pap smears due to her history of a hysterectomy for benign reasons per BCCCP and ASCCCP guidelines. Last Pap smear result is not available in Epic.   Physical exam: Breasts Right breast greater than left breast that is normal for her and due to history of left breast lumpectomy for benign breast lump. No skin abnormalities bilateral breasts. No nipple retraction bilateral breasts. No nipple discharge bilateral breasts. No lymphadenopathy. No lumps palpated bilateral breasts. No complaints of pain or tenderness on exam.     MS DIGITAL SCREENING TOMO BILATERAL Result Date: 07/20/2019 CLINICAL DATA:  Screening. EXAM: DIGITAL SCREENING BILATERAL MAMMOGRAM WITH TOMO AND CAD COMPARISON:  Previous exam(s). ACR Breast Density Category b: There are scattered areas of fibroglandular density. FINDINGS: There are no findings suspicious for malignancy. Images were processed with CAD. IMPRESSION: No mammographic evidence of malignancy. A result letter of this screening mammogram will be mailed directly to the patient. RECOMMENDATION: Screening mammogram in one year. (Code:SM-B-01Y) BI-RADS CATEGORY  1: Negative. Electronically Signed   By: Heidi Santiago M.D.   On: 07/20/2019 13:57   Pelvic/Bimanual Pap is not indicated today per BCCCP guidelines.   Smoking History: Patient has never smoked.  Patient Navigation: Patient education provided. Access to services provided for patient through [facility] program.   Colorectal Cancer Screening: Per patient has had colonoscopy completed on 06/17/2021.  No complaints today.     Breast and Cervical Cancer Risk Assessment: Patient does not have family history of breast cancer, known genetic mutations, or radiation treatment to the chest before age 61. Patient does not have history of cervical dysplasia, immunocompromised, or DES exposure in-utero.  Risk Scores as of Encounter on 10/26/2023     Heidi Santiago           5-year 1.3%   Lifetime 8.49%            Last calculated by Heidi Santiago, CMA on 10/26/2023 at  1:47 PM        A: BCCCP exam without pap smear No complaints.  P: Referred patient to the [facility] for a screening mammogram. Appointment scheduled Monday, October 26, 2023 at 1440.  Heidi Heidelberg, RN 10/26/2023 1:50 PM
